# Patient Record
Sex: Male | Born: 2000
Health system: Southern US, Community
[De-identification: ages and names within clinical notes are randomized; demographics above are authoritative.]

---
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
## 2020-12-17 DIAGNOSIS — R635 Abnormal weight gain: Secondary | ICD-10-CM | POA: Diagnosis not present

## 2020-12-17 DIAGNOSIS — Z0001 Encounter for general adult medical examination with abnormal findings: Secondary | ICD-10-CM | POA: Diagnosis not present

## 2021-01-04 ENCOUNTER — Other Ambulatory Visit: Payer: Self-pay

## 2021-01-04 ENCOUNTER — Encounter: Payer: Self-pay | Admitting: Emergency Medicine

## 2021-01-04 ENCOUNTER — Ambulatory Visit
Admission: EM | Admit: 2021-01-04 | Discharge: 2021-01-04 | Disposition: A | Discharge status: Home / Self Care | Payer: Medicaid Other

## 2021-01-04 ENCOUNTER — Ambulatory Visit (INDEPENDENT_AMBULATORY_CARE_PROVIDER_SITE_OTHER): Payer: Medicaid Other

## 2021-01-04 ENCOUNTER — Ambulatory Visit
Admission: RE | Admit: 2021-01-04 | Discharge: 2021-01-04 | Discharge status: Absent without leave | Payer: Medicaid Other | Source: Ambulatory Visit

## 2021-01-04 DIAGNOSIS — R0781 Pleurodynia: Secondary | ICD-10-CM

## 2021-01-04 DIAGNOSIS — R0789 Other chest pain: Secondary | ICD-10-CM

## 2021-01-04 MED ORDER — IBUPROFEN 800 MG PO TABS: 800.0000 mg | ORAL_TABLET | Freq: Three times a day (TID) | ORAL | 0 refills | Status: AC | PRN

## 2021-01-04 NOTE — Discharge Instructions (Signed)
Your pain is more of the superior chest and not really consistent with shoulder pain.  Additionally, you have normal range of motion of your shoulder and no injury to the shoulder so I do not suspect the pain is due to a problem with your shoulder.  A chest x-ray was obtained today since the pain is associated with breathing.  A chest x-ray is completely within normal limits.  We also obtained an EKG to check your heart rhythm and regularity.  The EKG is completely normal.  I suspect a musculoskeletal cause for your chest pain.  I would advise increasing rest and fluids.  I have sent prescription strength ibuprofen to help with the pain.  You can also take Tylenol if you need it.  You can try using ice or warm compresses to the area to see if that helps.  Go to emergency department if you have any severe acute worsening of the pain or if it is associated with any shortness of breath, dizziness, palpitations or racing heart, weakness or feeling faint/passing out.  Follow-up with your PCP if this is not getting better over the next week.

## 2021-01-04 NOTE — ED Triage Notes (Signed)
Pt c/o left shoulder pain when he takes a deep breath. started about 3 days ago, denies injury.

## 2021-01-04 NOTE — ED Provider Notes (Addendum)
[facility]    CSN: [id number] Arrival date & time: 01/04/21  1848      History   Chief Complaint Chief Complaint  Patient presents with  . Shoulder Pain    left    HPI Adam Shields. is a 20 y.o. male presenting for left superior chest pain on breathing x 3 days. The area of concern is just distal to the left clavicle. Pain is only associated with breathing. Denies any other symptoms. Pain only improved when not breathing deeply.  Denies fever, fatigue, cough, congestion, back pain, palpitations, dizziness, weakness, shortness of breath, wheezing. No injury or trauma. No change in pain with movement of left extremity. No pain of the actual shoulder. No n/t/w. No history of similar problems. Hx is significant for tobacco use. No recent illness. No hx of asthma. Denies known COVID exposure. No other concerns.  HPI  History reviewed. No pertinent past medical history.  There are no problems to display for this patient.   History reviewed. No pertinent surgical history.     Home Medications    Prior to Admission medications   Medication Sig Start Date End Date Taking? Authorizing Provider  ibuprofen (ADVIL) 800 MG tablet Take 1 tablet (800 mg total) by mouth every 8 (eight) hours as needed for up to 7 days for moderate pain. 01/04/21 01/11/21 Yes Shirlee Latch, PA-C  famotidine (PEPCID) 20 MG tablet Take 1 tablet (20 mg total) by mouth 2 (two) times daily. 06/20/17   Emily Filbert, MD  polyethylene glycol (MIRALAX / Ethelene Hal) packet Take 17 g by mouth daily. 06/20/17   Emily Filbert, MD  polyethylene glycol [facility]) packet Take 17 g by mouth daily. 08/22/16   Jennye Moccasin, MD  VYVANSE 40 MG capsule Take 40 mg by mouth every morning. 12/02/20   [provider]    Family History No family history on file.  Social History Social History   Tobacco Use  . Smoking status: Current Every Day Smoker    Types: Cigarettes  . Smokeless  tobacco: Never Used  Vaping Use  . Vaping Use: Never used  Substance Use Topics  . Alcohol use: No  . Drug use: No     Allergies   Patient has no known allergies.   Review of Systems Review of Systems  Constitutional: Negative for diaphoresis, fatigue and fever.  Respiratory: Negative for cough, shortness of breath and wheezing.   Cardiovascular: Positive for chest pain. Negative for palpitations.  Musculoskeletal: Negative for arthralgias, back pain, joint swelling and myalgias.  Skin: Negative for color change, rash and wound.  Neurological: Negative for dizziness, weakness and numbness.     Physical Exam Triage Vital Signs ED Triage Vitals  Enc Vitals Group     BP 01/04/21 1901 (!) 137/91     Pulse Rate 01/04/21 1901 85     Resp 01/04/21 1901 18     Temp 01/04/21 1901 98.5 F (36.9 C)     Temp Source 01/04/21 1901 Oral     SpO2 01/04/21 1901 100 %     Weight 01/04/21 1858 155 lb (70.3 kg)     Height 01/04/21 1858 5\' 5"  (1.651 m)     Head Circumference --      Peak Flow --      Pain Score 01/04/21 1858 6     Pain Loc --      Pain Edu? --      Excl. in GC? --  No data found.  Updated Vital Signs BP (!) 137/91 (BP Location: Right Arm)   Pulse 85   Temp 98.5 F (36.9 C) (Oral)   Resp 18   Ht 5\' 5"  (1.651 m)   Wt 155 lb (70.3 kg)   SpO2 100%   BMI 25.79 kg/m       Physical Exam Vitals and nursing note reviewed.  Constitutional:      General: He is not in acute distress.    Appearance: Normal appearance. He is well-developed and well-nourished. He is not ill-appearing.  HENT:     Head: Normocephalic and atraumatic.  Eyes:     General: No scleral icterus.    Conjunctiva/sclera: Conjunctivae normal.  Cardiovascular:     Rate and Rhythm: Normal rate and regular rhythm.     Heart sounds: Normal heart sounds.  Pulmonary:     Effort: Pulmonary effort is normal. No respiratory distress.     Breath sounds: Normal breath sounds. No wheezing, rhonchi or  rales.  Chest:     Chest wall: No tenderness.  Musculoskeletal:        General: No swelling, tenderness (no L shoulder TTP or reduced ROM), deformity or edema. Normal range of motion.     Cervical back: Neck supple.  Skin:    General: Skin is warm and dry.     Findings: No bruising or lesion.  Neurological:     General: No focal deficit present.     Mental Status: He is alert. Mental status is at baseline.     Motor: No weakness.     Gait: Gait normal.  Psychiatric:        Mood and Affect: Mood and affect and mood normal.        Behavior: Behavior normal.        Thought Content: Thought content normal.      UC Treatments / Results  Labs (all labs ordered are listed, but only abnormal results are displayed) Labs Reviewed - No data to display  EKG   Radiology DG Chest 2 View  Result Date: 01/04/2021 CLINICAL DATA:  Initial evaluation for acute chest pain with inspiration. EXAM: CHEST - 2 VIEW COMPARISON:  None available. FINDINGS: The cardiac and mediastinal silhouettes are within normal limits. The lungs are mildly hypoinflated. No airspace consolidation, pleural effusion, or pulmonary edema. No pneumothorax. No acute osseous abnormality. IMPRESSION: No active cardiopulmonary disease. Electronically Signed   By: 01/06/2021 M.D.   On: 01/04/2021 19:27    Procedures ED EKG  Date/Time: 01/04/2021 7:39 PM Performed by: 01/06/2021, PA-C Authorized by: Shirlee Latch, PA-C   ECG reviewed by ED Physician in the absence of a cardiologist: yes   Previous ECG:    Previous ECG:  Unavailable Interpretation:    Interpretation: normal   Rate:    ECG rate:  69   ECG rate assessment: normal   Rhythm:    Rhythm: sinus rhythm   Ectopy:    Ectopy: none   QRS:    QRS axis:  Normal ST segments:    ST segments:  Normal T waves:    T waves: normal   Comments:     Normal sinus rhythm, regular rate   (including critical care time)  Medications Ordered in  UC Medications - No data to display  Initial Impression / Assessment and Plan / UC Course  I have reviewed the triage vital signs and the nursing notes.  Pertinent labs & imaging results that  were available during my care of the patient were reviewed by me and considered in my medical decision making (see chart for details).   20 year old healthy male presenting for left superior chest pain x3 days.  Patient states that the only exacerbating factor is breathing and the only relieving factor is shallow breathing or holding his breath.  He has not taken any medications to help with symptoms however.  All vital signs are stable.  Patient is in no acute distress.  Exam is completely benign.  Chest is clear to auscultation heart regular rate and rhythm.  Oxygen saturation is 100%.  Additionally, he has no tenderness of the chest or of the shoulder.  He has full range of motion of the left shoulder without pain.  Chest x-ray obtained today since his pain is pleuritic.  DDx does include pneumothorax, pneumonia, chest mass, pulmonary embolism, pleurisy, costochondritis, chest wall pain/muscles strain.  Chest x-ray independently reviewed by me.  Chest x-ray interpreted by radiologist as within normal limits.  Agree with findings.  No real suspicion for PE given normal vital signs and no history of blood clotting disorder or DVT/PE.  EKG obtained to assess for possible pericarditis/myocarditis or other findings. EKG is WNL. Normal sinus rhythm and regular rate at 69 bpm.  Discussed all results with patient.  Advised him that his pain is likely musculoskeletal in origin.  Encouraged RICE and I sent prescription strength Motrin to the pharmacy.  Advised him he can also use Tylenol if needed for pain relief but I expect this will get better over the next couple of days.  Red flags discussed with patient for any worsening symptoms.  Patient understanding and agreeable.   Final Clinical Impressions(s) / UC  Diagnoses   Final diagnoses:  Pleuritic pain  Chest wall pain     Discharge Instructions     Your pain is more of the superior chest and not really consistent with shoulder pain.  Additionally, you have normal range of motion of your shoulder and no injury to the shoulder so I do not suspect the pain is due to a problem with your shoulder.  A chest x-ray was obtained today since the pain is associated with breathing.  A chest x-ray is completely within normal limits.  We also obtained an EKG to check your heart rhythm and regularity.  The EKG is completely normal.  I suspect a musculoskeletal cause for your chest pain.  I would advise increasing rest and fluids.  I have sent prescription strength ibuprofen to help with the pain.  You can also take Tylenol if you need it.  You can try using ice or warm compresses to the area to see if that helps.  Go to emergency department if you have any severe acute worsening of the pain or if it is associated with any shortness of breath, dizziness, palpitations or racing heart, weakness or feeling faint/passing out.  Follow-up with your PCP if this is not getting better over the next week.    ED Prescriptions    Medication Sig Dispense Auth. Provider   ibuprofen (ADVIL) 800 MG tablet Take 1 tablet (800 mg total) by mouth every 8 (eight) hours as needed for up to 7 days for moderate pain. 21 tablet Shirlee Latch, PA-C     PDMP not reviewed this encounter.   Shirlee Latch, PA-C 01/04/21 1950    Shirlee Latch, PA-C 01/04/21 1950

## 2021-03-17 ENCOUNTER — Emergency Department
Admission: EM | Admit: 2021-03-17 | Discharge: 2021-03-17 | Disposition: A | Discharge status: Home / Self Care | Payer: Medicaid Other | Attending: Emergency Medicine | Admitting: Emergency Medicine

## 2021-03-17 ENCOUNTER — Other Ambulatory Visit: Payer: Self-pay

## 2021-03-17 DIAGNOSIS — K625 Hemorrhage of anus and rectum: Secondary | ICD-10-CM | POA: Diagnosis not present

## 2021-03-17 DIAGNOSIS — R1032 Left lower quadrant pain: Secondary | ICD-10-CM | POA: Insufficient documentation

## 2021-03-17 DIAGNOSIS — R197 Diarrhea, unspecified: Secondary | ICD-10-CM | POA: Diagnosis present

## 2021-03-17 DIAGNOSIS — F17210 Nicotine dependence, cigarettes, uncomplicated: Secondary | ICD-10-CM | POA: Insufficient documentation

## 2021-03-17 LAB — CBC
HCT: 40.8 % (ref 39.0–52.0)
Hemoglobin: 14.2 g/dL (ref 13.0–17.0)
MCH: 29.8 pg (ref 26.0–34.0)
MCHC: 34.8 g/dL (ref 30.0–36.0)
MCV: 85.7 fL (ref 80.0–100.0)
Platelets: 268 10*3/uL (ref 150–400)
RBC: 4.76 MIL/uL (ref 4.22–5.81)
RDW: 12.5 % (ref 11.5–15.5)
WBC: 6.6 10*3/uL (ref 4.0–10.5)
nRBC: 0 % (ref 0.0–0.2)

## 2021-03-17 LAB — URINALYSIS, COMPLETE (UACMP) WITH MICROSCOPIC
Bacteria, UA: NONE SEEN
Bilirubin Urine: NEGATIVE
Glucose, UA: NEGATIVE mg/dL
Hgb urine dipstick: NEGATIVE
Ketones, ur: NEGATIVE mg/dL
Leukocytes,Ua: NEGATIVE
Nitrite: NEGATIVE
Protein, ur: NEGATIVE mg/dL
Specific Gravity, Urine: 1.02 (ref 1.005–1.030)
Squamous Epithelial / LPF: NONE SEEN (ref 0–5)
pH: 7 (ref 5.0–8.0)

## 2021-03-17 LAB — COMPREHENSIVE METABOLIC PANEL
ALT: 30 U/L (ref 0–44)
AST: 23 U/L (ref 15–41)
Albumin: 4.1 g/dL (ref 3.5–5.0)
Alkaline Phosphatase: 96 U/L (ref 38–126)
Anion gap: 6 (ref 5–15)
BUN: 8 mg/dL (ref 6–20)
CO2: 25 mmol/L (ref 22–32)
Calcium: 9.2 mg/dL (ref 8.9–10.3)
Chloride: 107 mmol/L (ref 98–111)
Creatinine, Ser: 0.83 mg/dL (ref 0.61–1.24)
GFR, Estimated: >60 mL/min (ref >60)
Glucose, Bld: 95 mg/dL (ref 70–99)
Potassium: 3.5 mmol/L (ref 3.5–5.1)
Sodium: 138 mmol/L (ref 135–145)
Total Bilirubin: 0.4 mg/dL (ref 0.3–1.2)
Total Protein: 7.9 g/dL (ref 6.5–8.1)

## 2021-03-17 LAB — LIPASE, BLOOD: Lipase: 41 U/L (ref 11–51)

## 2021-03-17 LAB — SAMPLE TO BLOOD BANK

## 2021-03-17 MED ORDER — LOPERAMIDE HCL 2 MG PO CAPS: 2.0000 mg | ORAL_CAPSULE | ORAL | 0 refills | Status: DC | PRN

## 2021-03-17 NOTE — ED Triage Notes (Signed)
Pt states that he has been having diarrhea for the past 4-5 days states that he has occasionally seen small amounts of red in his stool, states today he has had 3 loose stools and the last one was all bright red, denies any history of the same, denies eating or drinking anything red

## 2021-03-17 NOTE — ED Notes (Signed)
Pt reports blood tinged stools x 2-3 days, noticed more bright red blood tonight so he came to ED. Endorses LLQ abd cramping as well. +diarrhea. AAOx4 NAD VSS

## 2021-03-17 NOTE — ED Provider Notes (Signed)
[facility] Emergency Department Provider Note   ____________________________________________   Event Date/Time   First MD Initiated Contact with Patient 03/17/21 1931     (approximate)  I have reviewed the triage vital signs and the nursing notes.   HISTORY  Chief Complaint Abdominal Pain and GI Bleeding    HPI Adam Shields. is a 20 y.o. male with no significant past medical history who presents to the ED complaining of abdominal pain and bloody stool.  Patient reports that he has had 2 to 3 days of diarrhea that started out thin and watery.  More recently has noticed small streaks of blood in his stool that seem to increase today.  This is associated with crampy pain in the left lower quadrant of his abdomen, he denies any fevers, nausea, or vomiting.  He has not had any dysuria or hematuria.  He denies any similar symptoms in the past, has never had blood in his stool before.  He has had some pain in his rectal area with the bowel movements.        No past medical history on file.  There are no problems to display for this patient.   No past surgical history on file.  Prior to Admission medications   Medication Sig Start Date End Date Taking? Authorizing Provider  loperamide (IMODIUM) 2 MG capsule Take 1 capsule (2 mg total) by mouth as needed for diarrhea or loose stools. 03/17/21  Yes Chesley Noon, MD  famotidine (PEPCID) 20 MG tablet Take 1 tablet (20 mg total) by mouth 2 (two) times daily. 06/20/17   Emily Filbert, MD  polyethylene glycol (MIRALAX / Ethelene Hal) packet Take 17 g by mouth daily. 06/20/17   Emily Filbert, MD  polyethylene glycol [facility]) packet Take 17 g by mouth daily. 08/22/16   Jennye Moccasin, MD  VYVANSE 40 MG capsule Take 40 mg by mouth every morning. 12/02/20   [provider]    Allergies Patient has no known allergies.  No family history on file.  Social History Social History   Tobacco  Use  . Smoking status: Current Every Day Smoker    Types: Cigarettes  . Smokeless tobacco: Never Used  Vaping Use  . Vaping Use: Never used  Substance Use Topics  . Alcohol use: No  . Drug use: No    Review of Systems  Constitutional: No fever/chills Eyes: No visual changes. ENT: No sore throat. Cardiovascular: Denies chest pain. Respiratory: Denies shortness of breath. Gastrointestinal: Positive for abdominal pain.  No nausea, no vomiting.  Positive for diarrhea.  No constipation.  Positive for bloody stools. Genitourinary: Negative for dysuria. Musculoskeletal: Negative for back pain. Skin: Negative for rash. Neurological: Negative for headaches, focal weakness or numbness.  ____________________________________________   PHYSICAL EXAM:  VITAL SIGNS: ED Triage Vitals [03/17/21 1743]  Enc Vitals Group     BP 124/82     Pulse Rate 73     Resp 16     Temp 98.3 F (36.8 C)     Temp Source Oral     SpO2 99 %     Weight 165 lb (74.8 kg)     Height 5\' 6"  (1.676 m)     Head Circumference      Peak Flow      Pain Score 3     Pain Loc      Pain Edu?      Excl. in GC?     Constitutional:  Alert and oriented. Eyes: Conjunctivae are normal. Head: Atraumatic. Nose: No congestion/rhinnorhea. Mouth/Throat: Mucous membranes are moist. Neck: Normal ROM Cardiovascular: Normal rate, regular rhythm. Grossly normal heart sounds. Respiratory: Normal respiratory effort.  No retractions. Lungs CTAB. Gastrointestinal: Soft and nontender. No distention.  Rectal exam with small anal fissures, no hemorrhoids noted.  Stool is guaiac negative. Genitourinary: deferred Musculoskeletal: No lower extremity tenderness nor edema. Neurologic:  Normal speech and language. No gross focal neurologic deficits are appreciated. Skin:  Skin is warm, dry and intact. No rash noted. Psychiatric: Mood and affect are normal. Speech and behavior are  normal.  ____________________________________________   LABS (all labs ordered are listed, but only abnormal results are displayed)  Labs Reviewed  URINALYSIS, COMPLETE (UACMP) WITH MICROSCOPIC - Abnormal; Notable for the following components:      Result Value   Color, Urine YELLOW (*)    APPearance CLEAR (*)    All other components within normal limits  LIPASE, BLOOD  COMPREHENSIVE METABOLIC PANEL  CBC  SAMPLE TO BLOOD BANK    PROCEDURES  Procedure(s) performed (including Critical Care):  Procedures   ____________________________________________   INITIAL IMPRESSION / ASSESSMENT AND PLAN / ED COURSE       20 year old male with no significant past medical history presents the ED complaining of 2 to 3 days of diarrhea associated with streaks of blood in his stool.  He complains of crampy left lower quadrant abdominal pain but has no tenderness on exam.  Labs are reassuring, H&H is stable and I doubt significant GI bleeding at this time.  He does appear to have some anal fissures that are likely due to diarrhea and contributing to light bleeding.  Patient is appropriate for discharge home with prescription for loperamide, was counseled to follow-up with his PCP and return to the ED for new or worsening symptoms.  Patient agrees with plan.      ____________________________________________   FINAL CLINICAL IMPRESSION(S) / ED DIAGNOSES  Final diagnoses:  Diarrhea, unspecified type  Rectal bleeding     ED Discharge Orders         Ordered    loperamide (IMODIUM) 2 MG capsule  As needed        03/17/21 2025           Note:  This document was prepared using Dragon voice recognition software and may include unintentional dictation errors.   Chesley Noon, MD 03/17/21 2112

## 2021-08-04 ENCOUNTER — Encounter: Payer: Self-pay | Admitting: Emergency Medicine

## 2021-08-04 ENCOUNTER — Ambulatory Visit: Payer: Self-pay

## 2021-08-04 ENCOUNTER — Ambulatory Visit
Admission: EM | Admit: 2021-08-04 | Discharge: 2021-08-04 | Disposition: A | Discharge status: Home / Self Care | Payer: Medicaid Other | Attending: Sports Medicine | Admitting: Sports Medicine

## 2021-08-04 ENCOUNTER — Other Ambulatory Visit: Payer: Self-pay

## 2021-08-04 DIAGNOSIS — R059 Cough, unspecified: Secondary | ICD-10-CM | POA: Diagnosis not present

## 2021-08-04 DIAGNOSIS — J069 Acute upper respiratory infection, unspecified: Secondary | ICD-10-CM

## 2021-08-04 DIAGNOSIS — J028 Acute pharyngitis due to other specified organisms: Secondary | ICD-10-CM

## 2021-08-04 DIAGNOSIS — U071 COVID-19: Secondary | ICD-10-CM

## 2021-08-04 DIAGNOSIS — B9789 Other viral agents as the cause of diseases classified elsewhere: Secondary | ICD-10-CM

## 2021-08-04 DIAGNOSIS — B349 Viral infection, unspecified: Secondary | ICD-10-CM | POA: Diagnosis not present

## 2021-08-04 DIAGNOSIS — R0981 Nasal congestion: Secondary | ICD-10-CM | POA: Diagnosis not present

## 2021-08-04 DIAGNOSIS — R509 Fever, unspecified: Secondary | ICD-10-CM | POA: Diagnosis not present

## 2021-08-04 MED ORDER — FLUTICASONE PROPIONATE 50 MCG/ACT NA SUSP: 2.0000 | Freq: Every day | NASAL | 0 refills | Status: AC

## 2021-08-04 MED ORDER — MOLNUPIRAVIR EUA 200MG CAPSULE: 4.0000 | ORAL_CAPSULE | Freq: Two times a day (BID) | ORAL | 0 refills | Status: AC

## 2021-08-04 MED ORDER — PROMETHAZINE-DM 6.25-15 MG/5ML PO SYRP: 5.0000 mL | ORAL_SOLUTION | Freq: Four times a day (QID) | ORAL | 0 refills | Status: DC | PRN

## 2021-08-04 NOTE — ED Triage Notes (Signed)
Pt c/o sore throat, fatigue, fever (105 per pt), weakness. Started yesterday. He states he did a home test and was positive. Does not want to be retested. Pt is requesting antivirals.

## 2021-08-04 NOTE — ED Provider Notes (Signed)
[facility]    CSN: [id number] Arrival date & time: 08/04/21  1454      History   Chief Complaint Chief Complaint  Patient presents with   Covid Positive   Fever    HPI Adam Shields. is a 20 y.o. male.   20 year old male presents for evaluation of URI symptoms.  He reports his symptoms began yesterday.  He presents today with a sore throat, headache, fever, fatigue, cough, nasal congestion, and generalized malaise.  He denies any nausea vomiting or diarrhea.  He believes that he did have COVID exposure from a friend.  He has not been vaccinated against COVID.  He has not received the flu shot.  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  He does not have a primary care provider.  He denies chest pain or shortness of breath.  Overall he is generally healthy, does not take any medicines on a regular basis, and he denies any major medical issues.  No red flag signs or symptoms were elicited on history.   History reviewed. No pertinent past medical history.  There are no problems to display for this patient.   History reviewed. No pertinent surgical history.     Home Medications    Prior to Admission medications   Medication Sig Start Date End Date Taking? Authorizing Provider  fluticasone (FLONASE) 50 MCG/ACT nasal spray Place 2 sprays into both nostrils daily. 08/04/21  Yes Delton See, MD  molnupiravir EUA 200 mg CAPS Take 4 capsules (800 mg total) by mouth 2 (two) times daily for 5 days. 08/04/21 08/09/21 Yes Delton See, MD  promethazine-dextromethorphan (PROMETHAZINE-DM) 6.25-15 MG/5ML syrup Take 5 mLs by mouth 4 (four) times daily as needed for cough. 08/04/21  Yes Delton See, MD  famotidine (PEPCID) 20 MG tablet Take 1 tablet (20 mg total) by mouth 2 (two) times daily. 06/20/17   Emily Filbert, MD  loperamide (IMODIUM) 2 MG capsule Take 1 capsule (2 mg total) by mouth as needed for diarrhea or loose stools. 03/17/21   Chesley Noon, MD  polyethylene glycol [facility] / Ethelene Hal) packet Take 17 g by mouth daily. 06/20/17   Emily Filbert, MD  polyethylene glycol [facility]) packet Take 17 g by mouth daily. 08/22/16   Jennye Moccasin, MD  VYVANSE 40 MG capsule Take 40 mg by mouth every morning. 12/02/20   [provider]    Family History History reviewed. No pertinent family history.  Social History Social History   Tobacco Use   Smoking status: Former    Types: Cigarettes    Quit date: 06/04/2021    Years since quitting: 0.1   Smokeless tobacco: Never  Vaping Use   Vaping Use: Never used  Substance Use Topics   Alcohol use: No   Drug use: No     Allergies   Patient has no known allergies.   Review of Systems Review of Systems  Constitutional:  Positive for activity change, chills, fatigue and fever. Negative for appetite change and diaphoresis.  HENT:  Positive for congestion and sore throat. Negative for ear pain, postnasal drip, rhinorrhea, sinus pressure, sinus pain and sneezing.   Eyes:  Negative for pain.  Respiratory:  Positive for cough. Negative for chest tightness, shortness of breath and wheezing.   Cardiovascular:  Negative for chest pain and palpitations.  Gastrointestinal:  Negative for abdominal pain, diarrhea, nausea and vomiting.  Genitourinary:  Negative for dysuria.  Musculoskeletal:  Positive for myalgias.  Negative for back pain and neck pain.  Skin:  Negative for color change, pallor, rash and wound.  Neurological:  Positive for headaches. Negative for dizziness, syncope and light-headedness.  All other systems reviewed and are negative.   Physical Exam Triage Vital Signs ED Triage Vitals  Enc Vitals Group     BP 08/04/21 1518 114/66     Pulse Rate 08/04/21 1518 (!) 117     Resp 08/04/21 1518 20     Temp 08/04/21 1518 (!) 102.4 F (39.1 C)     Temp Source 08/04/21 1518 Oral     SpO2 08/04/21 1518 99 %     Weight 08/04/21 1514 164 lb 14.5 oz (74.8 kg)      Height 08/04/21 1514 5\' 6"  (1.676 m)     Head Circumference --      Peak Flow --      Pain Score 08/04/21 1513 7     Pain Loc --      Pain Edu? --      Excl. in GC? --    No data found.  Updated Vital Signs BP 114/66 (BP Location: Left Arm)   Pulse (!) 117   Temp (!) 102.4 F (39.1 C) (Oral) Comment: unsure the last time he had medication  Resp 20   Ht 5\' 6"  (1.676 m)   Wt 74.8 kg   SpO2 99%   BMI 26.62 kg/m   Visual Acuity Right Eye Distance:   Left Eye Distance:   Bilateral Distance:    Right Eye Near:   Left Eye Near:    Bilateral Near:     Physical Exam Vitals and nursing note reviewed.  Constitutional:      General: He is not in acute distress.    Appearance: Normal appearance. He is not ill-appearing, toxic-appearing or diaphoretic.  HENT:     Head: Normocephalic and atraumatic.     Right Ear: Tympanic membrane normal.     Left Ear: Tympanic membrane normal.     Nose: Congestion and rhinorrhea present.     Mouth/Throat:     Mouth: Mucous membranes are moist.     Pharynx: Posterior oropharyngeal erythema present. No oropharyngeal exudate.  Eyes:     General: No scleral icterus.       Right eye: No discharge.        Left eye: No discharge.     Extraocular Movements: Extraocular movements intact.     Conjunctiva/sclera: Conjunctivae normal.     Pupils: Pupils are equal, round, and reactive to light.  Cardiovascular:     Rate and Rhythm: Normal rate and regular rhythm.     Pulses: Normal pulses.     Heart sounds: Normal heart sounds. No murmur heard.   No friction rub. No gallop.  Pulmonary:     Effort: Pulmonary effort is normal.     Breath sounds: Normal breath sounds. No stridor. No wheezing, rhonchi or rales.  Musculoskeletal:     Cervical back: Normal range of motion and neck supple. No rigidity or tenderness.  Lymphadenopathy:     Cervical: No cervical adenopathy.  Skin:    General: Skin is warm and dry.     Capillary Refill: Capillary refill  takes less than 2 seconds.     Coloration: Skin is not jaundiced.     Findings: No rash.  Neurological:     General: No focal deficit present.     Mental Status: He is alert and oriented to person, place, and  time.     GCS: GCS eye subscore is 4. GCS verbal subscore is 5. GCS motor subscore is 6.     Cranial Nerves: Cranial nerves are intact.     Sensory: Sensation is intact.     Motor: Motor function is intact.     Coordination: Coordination is intact.     UC Treatments / Results  Labs (all labs ordered are listed, but only abnormal results are displayed) Labs Reviewed - No data to display  EKG   Radiology No results found.  Procedures Procedures (including critical care time)  Medications Ordered in UC Medications - No data to display  Initial Impression / Assessment and Plan / UC Course  I have reviewed the triage vital signs and the nursing notes.  Pertinent labs & imaging results that were available during my care of the patient were reviewed by me and considered in my medical decision making (see chart for details).  Clinical impression: 1.  Positive COVID-19 home test 2.  Febrile illness 3.  Upper respiratory infection 4.  Cough 5.  Nasal congestion 6.  Sore throat viral in nature 7.  Viral illness  Treatment plan: 1.  The findings and treatment plan were discussed in detail with the patient.  Patient was in agreement. 2.  We discussed repeating his home COVID test but he declined.  Given his symptoms and his vital signs as well as his positive home COVID test I will treat him accordingly.  He was asking for oral antivirals for COVID.  I went ahead and prescribed.  I also prescribed something for his cough and his nasal congestion.  I did warn him of the side effects and the fact that he does not have any major medical issues and that there are side effects with this medicine.  He voiced verbal understanding but wanted the medicine anyway. 3.  Educational  handouts provided. 4.  Indicated to him that the mainstay of treatment is going to be round-the-clock Tylenol and ibuprofen to keep his fever down and he will feel much better. 5.  Supportive care, over-the-counter meds as needed, salt water gargles, throat lozenges, and supportive care. 6.  If symptoms worsen I advised him to go to the ER. 7.  Give him a work note keep him out of work for at least 5 days. 8.  I advised him that he needed to quarantine for least 5 days per the current CDC guidelines. 9.  He was stable upon discharge and will follow-up here as needed.    Final Clinical Impressions(s) / UC Diagnoses   Final diagnoses:  COVID-19  Febrile illness  Upper respiratory tract infection, unspecified type  Viral illness  Cough  Nasal congestion  Sore throat (viral)     Discharge Instructions      As we discussed, with your COVID exposure, you are positive home test, and your vital signs I am going to go ahead and treat you with one of the new antiviral COVID medication.  This is per your request.  I did explain that there are side effects to this medication and you do not have any significant medical conditions.  I am also prescribing something for cough and nasal congestion.  The mainstay of treatment is going to be keeping her fever down with round-the-clock Tylenol and ibuprofen or Motrin. Please see educational handouts. If your symptoms were to worsen in any way then please go to the emergency room or call 911. I did give you a  work note keep you out of work for at least 5 days per the current CDC guidelines.  If you are still symptomatic after 5 days then you need to return here for reevaluation or go to the ER or your primary care provider.     ED Prescriptions     Medication Sig Dispense Auth. Provider   molnupiravir EUA 200 mg CAPS Take 4 capsules (800 mg total) by mouth 2 (two) times daily for 5 days. 40 capsule Delton SeeBarnes, Rachyl Wuebker, MD   promethazine-dextromethorphan  (PROMETHAZINE-DM) 6.25-15 MG/5ML syrup Take 5 mLs by mouth 4 (four) times daily as needed for cough. 180 mL Delton SeeBarnes, Ardena Gangl, MD   fluticasone [facility](FLONASE) 50 MCG/ACT nasal spray Place 2 sprays into both nostrils daily. 15.8 mL Delton SeeBarnes, Alizabeth Antonio, MD      PDMP not reviewed this encounter.   Delton SeeBarnes, James Senn, MD 08/04/21 1810

## 2021-08-04 NOTE — Discharge Instructions (Addendum)
As we discussed, with your COVID exposure, you are positive home test, and your vital signs I am going to go ahead and treat you with one of the new antiviral COVID medication.  This is per your request.  I did explain that there are side effects to this medication and you do not have any significant medical conditions.  I am also prescribing something for cough and nasal congestion.  The mainstay of treatment is going to be keeping her fever down with round-the-clock Tylenol and ibuprofen or Motrin. Please see educational handouts. If your symptoms were to worsen in any way then please go to the emergency room or call 911. I did give you a work note keep you out of work for at least 5 days per the current CDC guidelines.  If you are still symptomatic after 5 days then you need to return here for reevaluation or go to the ER or your primary care provider.

## 2021-08-31 ENCOUNTER — Other Ambulatory Visit: Payer: Self-pay

## 2021-08-31 ENCOUNTER — Emergency Department: Payer: Medicaid Other

## 2021-08-31 ENCOUNTER — Emergency Department
Admission: EM | Admit: 2021-08-31 | Discharge: 2021-08-31 | Disposition: A | Discharge status: Home / Self Care | Payer: Medicaid Other | Attending: Emergency Medicine | Admitting: Emergency Medicine

## 2021-08-31 DIAGNOSIS — S91331A Puncture wound without foreign body, right foot, initial encounter: Secondary | ICD-10-CM | POA: Diagnosis not present

## 2021-08-31 DIAGNOSIS — W450XXA Nail entering through skin, initial encounter: Secondary | ICD-10-CM | POA: Insufficient documentation

## 2021-08-31 DIAGNOSIS — Y990 Civilian activity done for income or pay: Secondary | ICD-10-CM | POA: Diagnosis not present

## 2021-08-31 DIAGNOSIS — S91301A Unspecified open wound, right foot, initial encounter: Secondary | ICD-10-CM | POA: Insufficient documentation

## 2021-08-31 DIAGNOSIS — Z87891 Personal history of nicotine dependence: Secondary | ICD-10-CM | POA: Diagnosis not present

## 2021-08-31 DIAGNOSIS — S99921A Unspecified injury of right foot, initial encounter: Secondary | ICD-10-CM

## 2021-08-31 MED ORDER — CIPROFLOXACIN HCL 500 MG PO TABS: 500.0000 mg | ORAL_TABLET | Freq: Two times a day (BID) | ORAL | 0 refills | Status: AC

## 2021-08-31 NOTE — ED Provider Notes (Signed)
[facility] Emergency Department Provider Note ____________________________________________   Event Date/Time   First MD Initiated Contact with Patient 08/31/21 1152     (approximate)  I have reviewed the triage vital signs and the nursing notes.  HISTORY  Chief Complaint Foot Injury (PT STEPPED ON A NAIL RIGHT FOOT )   HPI Adam Shields. is a 20 y.o. malewho presents to the ED for evaluation of foot puncture wound  Chart review indicates no relevant history.  Patient reports accidentally stepping on a rusty nail while at work today.  He reports a puncture wound to the plantar aspect of his right foot, going through his work boots and socks.  He reports this injury happened just prior to arrival.  Reports mild nonradiating sharp pains.  He reports that he finished high school and had all the shots up-to-date, and thinks he had a Tdap in the past few years for school.  History reviewed. No pertinent past medical history.  There are no problems to display for this patient.   History reviewed. No pertinent surgical history.  Prior to Admission medications   Medication Sig Start Date End Date Taking? Authorizing Provider  ciprofloxacin (CIPRO) 500 MG tablet Take 1 tablet (500 mg total) by mouth 2 (two) times daily for 7 days. 08/31/21 09/07/21 Yes Delton Prairie, MD  famotidine (PEPCID) 20 MG tablet Take 1 tablet (20 mg total) by mouth 2 (two) times daily. 06/20/17   Emily Filbert, MD  fluticasone (FLONASE) 50 MCG/ACT nasal spray Place 2 sprays into both nostrils daily. 08/04/21   Delton See, MD  loperamide (IMODIUM) 2 MG capsule Take 1 capsule (2 mg total) by mouth as needed for diarrhea or loose stools. 03/17/21   Chesley Noon, MD  polyethylene glycol [facility] / Ethelene Hal) packet Take 17 g by mouth daily. 06/20/17   Emily Filbert, MD  polyethylene glycol [facility]) packet Take 17 g by mouth daily. 08/22/16   Jennye Moccasin, MD   promethazine-dextromethorphan (PROMETHAZINE-DM) 6.25-15 MG/5ML syrup Take 5 mLs by mouth 4 (four) times daily as needed for cough. 08/04/21   Delton See, MD  VYVANSE 40 MG capsule Take 40 mg by mouth every morning. 12/02/20   [provider]    Allergies Patient has no known allergies.  No family history on file.  Social History Social History   Tobacco Use   Smoking status: Former    Types: Cigarettes    Quit date: 06/04/2021    Years since quitting: 0.2   Smokeless tobacco: Never  Vaping Use   Vaping Use: Never used  Substance Use Topics   Alcohol use: No   Drug use: No    Review of Systems  Constitutional: No fever/chills Eyes: No visual changes. ENT: No sore throat. Cardiovascular: Denies chest pain. Respiratory: Denies shortness of breath. Gastrointestinal: No abdominal pain.  No nausea, no vomiting.  No diarrhea.  No constipation. Genitourinary: Negative for dysuria. Musculoskeletal: Negative for back pain. Positive for right foot pain and puncture wound Skin: Negative for rash. Neurological: Negative for headaches, focal weakness or numbness.  ____________________________________________   PHYSICAL EXAM:  VITAL SIGNS: Vitals:   08/31/21 1127  BP: 126/85  Pulse: 69  Resp: 17  Temp: 98.8 F (37.1 C)  SpO2: 98%    Constitutional: Alert and oriented. Well appearing and in no acute distress. Eyes: Conjunctivae are normal. PERRL. EOMI. Head: Atraumatic. Nose: No congestion/rhinnorhea. Mouth/Throat: Mucous membranes are moist.  Oropharynx non-erythematous. Neck: No stridor. No  cervical spine tenderness to palpation. Cardiovascular: Normal rate, regular rhythm. Grossly normal heart sounds.  Good peripheral circulation. Respiratory: Normal respiratory effort.  No retractions. Lungs CTAB. Gastrointestinal: Soft , nondistended, nontender to palpation. No CVA tenderness. Musculoskeletal: No lower extremity tenderness nor edema.  No joint  effusions.  Puncture wound to the distal aspect of the plantar right foot.  Seems superficial, into the dermis only.  No laceration or bleeding pathology.  No bony tenderness throughout the foot and ankle.  No erythema, induration or purulence. Neurologic:  Normal speech and language. No gross focal neurologic deficits are appreciated. No gait instability noted. Skin:  Skin is warm, dry and intact. No rash noted. Psychiatric: Mood and affect are normal. Speech and behavior are normal.  ____________________________________________   LABS (all labs ordered are listed, but only abnormal results are displayed)  Labs Reviewed - No data to display ____________________________________________  12 Lead EKG   ____________________________________________  RADIOLOGY  ED MD interpretation:    Official radiology report(s): No results found.  ____________________________________________   PROCEDURES and INTERVENTIONS  Procedure(s) performed (including Critical Care):  Procedures  Medications - No data to display  ____________________________________________   MDM / ED COURSE   Otherwise healthy 20 year old male present to the ED with puncture wound to his right foot, amenable to outpatient management with prophylactic antibiotics.  Normal vitals and reassuring clinical exam.  He seems to have a very superficial puncture wound to his right foot, but did break into the dermis after going through his socks and shoes.  X-ray without evidence of foreign body or bony injury, no evidence of osteo or bony erosion.  He reports that he is up-to-date on his Tdap from his grade school vaccinations.  Considering the mechanism of injury, we will provide a course of ciprofloxacin for Pseudomonas prophylaxis.  Return precautions were discussed  Clinical Course as of 08/31/21 1607  Tue Aug 31, 2021  1223 We discussed need for imaging to assess for foreign body, and likely antibiotics prophylactically  considering the mechanism.  He reports that he is quite confident that his shots are up-to-date and does not need tetanus.  He is okay with not getting Tdap today because of this. [DS]    Clinical Course User Index [DS] Delton Prairie, MD    ____________________________________________   FINAL CLINICAL IMPRESSION(S) / ED DIAGNOSES  Final diagnoses:  Injury of right foot, initial encounter  Puncture wound of right foot, initial encounter     ED Discharge Orders          Ordered    ciprofloxacin (CIPRO) 500 MG tablet  2 times daily        08/31/21 1206             Melyna Huron   Note:  This document was prepared using Dragon voice recognition software and may include unintentional dictation errors.    Delton Prairie, MD 08/31/21 [phone]

## 2021-08-31 NOTE — ED Triage Notes (Signed)
First Nurse Note:  Stepped on nail with left foot.  Needs tetanus shot.

## 2021-08-31 NOTE — ED Notes (Signed)
See triage note. Pt stepped on a a nail with his right foot and is c/o pain.

## 2021-08-31 NOTE — ED Triage Notes (Signed)
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

## 2021-08-31 NOTE — Discharge Instructions (Signed)
Use the ciprofloxacin antibiotics twice daily for the next 7 days to prevent infection from your puncture wound.  I would urge you to use probiotics while taking this medication, this could be any over-the-counter probiotic medication, or just eating yogurt every day.  If develop any spreading red rash, fevers or worsening pain, return to the ED.  Please take Tylenol and ibuprofen/Advil for your pain.  It is safe to take them together, or to alternate them every few hours.  Take up to 1000mg  of Tylenol at a time, up to 4 times per day.  Do not take more than 4000 mg of Tylenol in 24 hours.  For ibuprofen, take 400-600 mg, 4-5 times per day.

## 2021-09-24 DIAGNOSIS — Z79899 Other long term (current) drug therapy: Secondary | ICD-10-CM | POA: Diagnosis not present

## 2021-09-24 DIAGNOSIS — F329 Major depressive disorder, single episode, unspecified: Secondary | ICD-10-CM | POA: Diagnosis not present

## 2021-09-24 DIAGNOSIS — F902 Attention-deficit hyperactivity disorder, combined type: Secondary | ICD-10-CM | POA: Diagnosis not present

## 2021-09-24 DIAGNOSIS — Z1389 Encounter for screening for other disorder: Secondary | ICD-10-CM | POA: Diagnosis not present

## 2021-12-03 DIAGNOSIS — Z0000 Encounter for general adult medical examination without abnormal findings: Secondary | ICD-10-CM | POA: Diagnosis not present

## 2021-12-24 DIAGNOSIS — F902 Attention-deficit hyperactivity disorder, combined type: Secondary | ICD-10-CM | POA: Diagnosis not present

## 2022-02-06 ENCOUNTER — Encounter: Payer: Self-pay | Admitting: Emergency Medicine

## 2022-02-06 ENCOUNTER — Other Ambulatory Visit: Payer: Self-pay

## 2022-02-06 ENCOUNTER — Ambulatory Visit
Admission: EM | Admit: 2022-02-06 | Discharge: 2022-02-06 | Disposition: A | Discharge status: Home / Self Care | Payer: Medicaid Other | Attending: Family Medicine | Admitting: Family Medicine

## 2022-02-06 DIAGNOSIS — Z711 Person with feared health complaint in whom no diagnosis is made: Secondary | ICD-10-CM | POA: Insufficient documentation

## 2022-02-06 DIAGNOSIS — Z0289 Encounter for other administrative examinations: Secondary | ICD-10-CM | POA: Diagnosis not present

## 2022-02-06 NOTE — ED Provider Notes (Signed)
Adam Shields    CSN: [id number] Arrival date & time: 02/06/22  1309      History   Chief Complaint Chief Complaint  Patient presents with   Abrasion    HPI Adam Shields. is a 21 y.o. male.   HPI Patient initially presented today following a abrasion on his penis.  After entering the room he refused to allow the examination however wanted STD testing and requested a work note.  He denies any known exposure to STDs   History reviewed. No pertinent past medical history.  There are no problems to display for this patient.   History reviewed. No pertinent surgical history.     Home Medications    Prior to Admission medications   Medication Sig Start Date End Date Taking? Authorizing Provider  VYVANSE 40 MG capsule Take 40 mg by mouth every morning. 12/02/20  Yes [provider]  famotidine (PEPCID) 20 MG tablet Take 1 tablet (20 mg total) by mouth 2 (two) times daily. 06/20/17   Earleen Newport, MD  fluticasone (FLONASE) 50 MCG/ACT nasal spray Place 2 sprays into both nostrils daily. 08/04/21   Verda Cumins, MD  loperamide (IMODIUM) 2 MG capsule Take 1 capsule (2 mg total) by mouth as needed for diarrhea or loose stools. 03/17/21   Blake Divine, MD  polyethylene glycol [facility] Floria Raveling) packet Take 17 g by mouth daily. 06/20/17   Earleen Newport, MD  polyethylene glycol [facility]) packet Take 17 g by mouth daily. 08/22/16   Daymon Larsen, MD  promethazine-dextromethorphan (PROMETHAZINE-DM) 6.25-15 MG/5ML syrup Take 5 mLs by mouth 4 (four) times daily as needed for cough. 08/04/21   Verda Cumins, MD    Family History History reviewed. No pertinent family history.  Social History Social History   Tobacco Use   Smoking status: Every Day    Types: Cigarettes    Last attempt to quit: 06/04/2021    Years since quitting: 0.6   Smokeless tobacco: Never  Vaping Use   Vaping Use: Never used  Substance Use Topics   Alcohol use: No    Drug use: No     Allergies   Patient has no known allergies.   Review of Systems Review of Systems Pertinent negatives listed in HPI   Physical Exam Triage Vital Signs ED Triage Vitals  Enc Vitals Group     BP 02/06/22 1505 131/83     Pulse Rate 02/06/22 1505 91     Resp 02/06/22 1505 16     Temp 02/06/22 1505 99.3 F (37.4 C)     Temp Source 02/06/22 1505 Oral     SpO2 02/06/22 1505 97 %     Weight --      Height --      Head Circumference --      Peak Flow --      Pain Score 02/06/22 1503 0     Pain Loc --      Pain Edu? --      Excl. in [place]? --    No data found.  Updated Vital Signs BP 131/83 (BP Location: Left Arm)    Pulse 91    Temp 99.3 F (37.4 C) (Oral)    Resp 16    SpO2 97%   Visual Acuity Right Eye Distance:   Left Eye Distance:   Bilateral Distance:    Right Eye Near:   Left Eye Near:    Bilateral Near:     Physical  Exam HENT:     Head: Normocephalic.  Eyes:     Extraocular Movements: Extraocular movements intact.     Pupils: Pupils are equal, round, and reactive to light.  Cardiovascular:     Rate and Rhythm: Normal rate.  Pulmonary:     Effort: Pulmonary effort is normal.  Neurological:     Mental Status: He is alert.  Psychiatric:        Mood and Affect: Mood normal.        Behavior: Behavior normal.        Thought Content: Thought content normal.        Judgment: Judgment normal.    UC Treatments / Results  Labs (all labs ordered are listed, but only abnormal results are displayed) Labs Reviewed  CYTOLOGY, (ORAL, ANAL, URETHRAL) ANCILLARY ONLY    EKG   Radiology No results found.  Procedures Procedures (including critical care time)  Medications Ordered in UC Medications - No data to display  Initial Impression / Assessment and Plan / UC Course  I have reviewed the triage vital signs and the nursing notes.  Pertinent labs & imaging results that were available during my care of the patient were reviewed by me and  considered in my medical decision making (see chart for details).    Urethral cytology pending. Work note provided. RTC PRN  Final Clinical Impressions(s) / UC Diagnoses   Final diagnoses:  Encounter to obtain excuse from work  Concern about STD in male without diagnosis   Discharge Instructions   None    ED Prescriptions   None    PDMP not reviewed this encounter.   Scot Jun, FNP 02/06/22 [phone]

## 2022-02-06 NOTE — ED Triage Notes (Signed)
Pt presents with an abrasion on the head of his penis. Pt does not want to disclose how he got the abrasion.

## 2022-02-07 LAB — CYTOLOGY, (ORAL, ANAL, URETHRAL) ANCILLARY ONLY
Chlamydia: NEGATIVE
Comment: NEGATIVE
Comment: NEGATIVE
Comment: NORMAL
Neisseria Gonorrhea: NEGATIVE
Trichomonas: NEGATIVE

## 2022-02-24 DIAGNOSIS — H5213 Myopia, bilateral: Secondary | ICD-10-CM | POA: Diagnosis not present

## 2022-03-18 DIAGNOSIS — F329 Major depressive disorder, single episode, unspecified: Secondary | ICD-10-CM | POA: Diagnosis not present

## 2022-03-18 DIAGNOSIS — F902 Attention-deficit hyperactivity disorder, combined type: Secondary | ICD-10-CM | POA: Diagnosis not present

## 2022-06-16 DIAGNOSIS — F902 Attention-deficit hyperactivity disorder, combined type: Secondary | ICD-10-CM | POA: Diagnosis not present

## 2022-06-16 DIAGNOSIS — F329 Major depressive disorder, single episode, unspecified: Secondary | ICD-10-CM | POA: Diagnosis not present

## 2022-09-09 DIAGNOSIS — F902 Attention-deficit hyperactivity disorder, combined type: Secondary | ICD-10-CM | POA: Diagnosis not present

## 2022-09-09 DIAGNOSIS — F329 Major depressive disorder, single episode, unspecified: Secondary | ICD-10-CM | POA: Diagnosis not present

## 2022-09-13 DIAGNOSIS — R635 Abnormal weight gain: Secondary | ICD-10-CM | POA: Diagnosis not present

## 2022-09-13 DIAGNOSIS — Z0001 Encounter for general adult medical examination with abnormal findings: Secondary | ICD-10-CM | POA: Diagnosis not present

## 2022-09-13 DIAGNOSIS — Z7251 High risk heterosexual behavior: Secondary | ICD-10-CM | POA: Diagnosis not present

## 2022-12-05 DIAGNOSIS — L2089 Other atopic dermatitis: Secondary | ICD-10-CM | POA: Diagnosis not present

## 2022-12-05 DIAGNOSIS — R635 Abnormal weight gain: Secondary | ICD-10-CM | POA: Diagnosis not present

## 2022-12-05 DIAGNOSIS — B349 Viral infection, unspecified: Secondary | ICD-10-CM | POA: Diagnosis not present

## 2022-12-05 DIAGNOSIS — J029 Acute pharyngitis, unspecified: Secondary | ICD-10-CM | POA: Diagnosis not present

## 2022-12-07 DIAGNOSIS — Z79899 Other long term (current) drug therapy: Secondary | ICD-10-CM | POA: Diagnosis not present

## 2022-12-07 DIAGNOSIS — Z1389 Encounter for screening for other disorder: Secondary | ICD-10-CM | POA: Diagnosis not present

## 2022-12-07 DIAGNOSIS — F902 Attention-deficit hyperactivity disorder, combined type: Secondary | ICD-10-CM | POA: Diagnosis not present

## 2022-12-07 DIAGNOSIS — F329 Major depressive disorder, single episode, unspecified: Secondary | ICD-10-CM | POA: Diagnosis not present

## 2022-12-15 IMAGING — DX DG FOOT COMPLETE 3+V*R*
3 series · 3 of 3 positions shown · non-contrast
Comparison: None.

CLINICAL DATA: Stepped on a nail

EXAM:
RIGHT FOOT COMPLETE - 3+ VIEW

[foot ap]
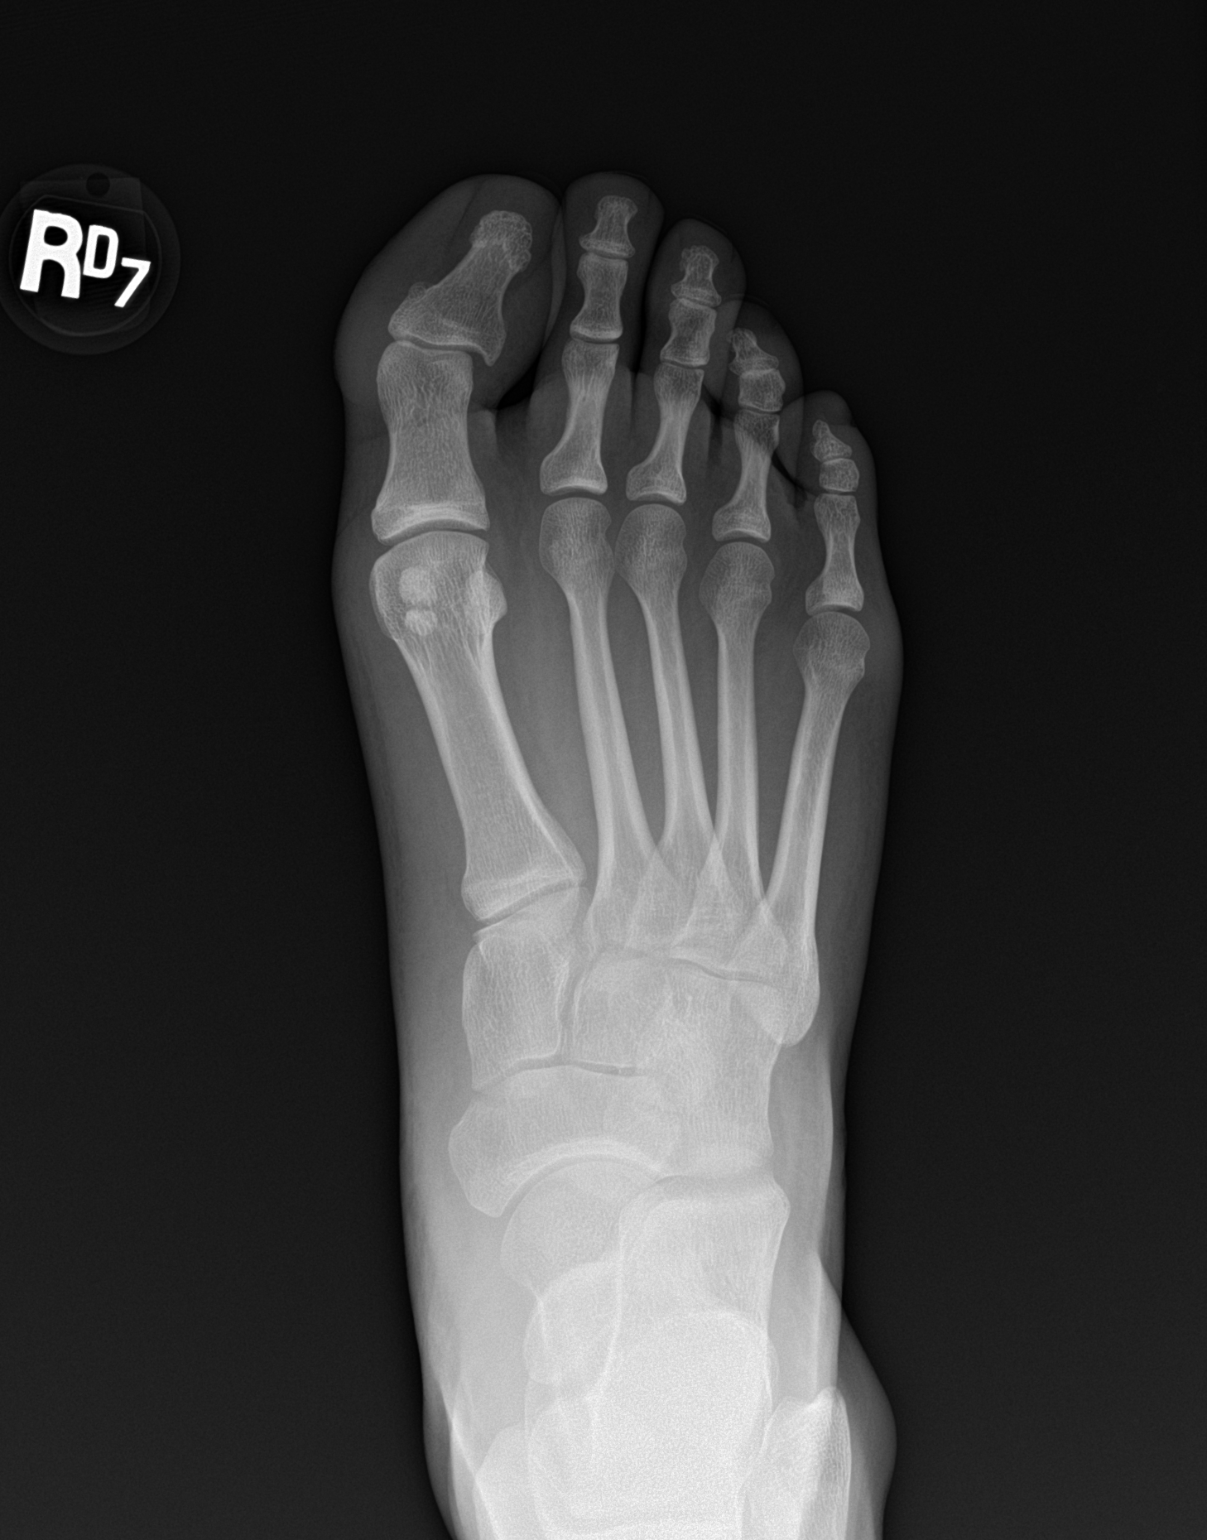

[foot obl]
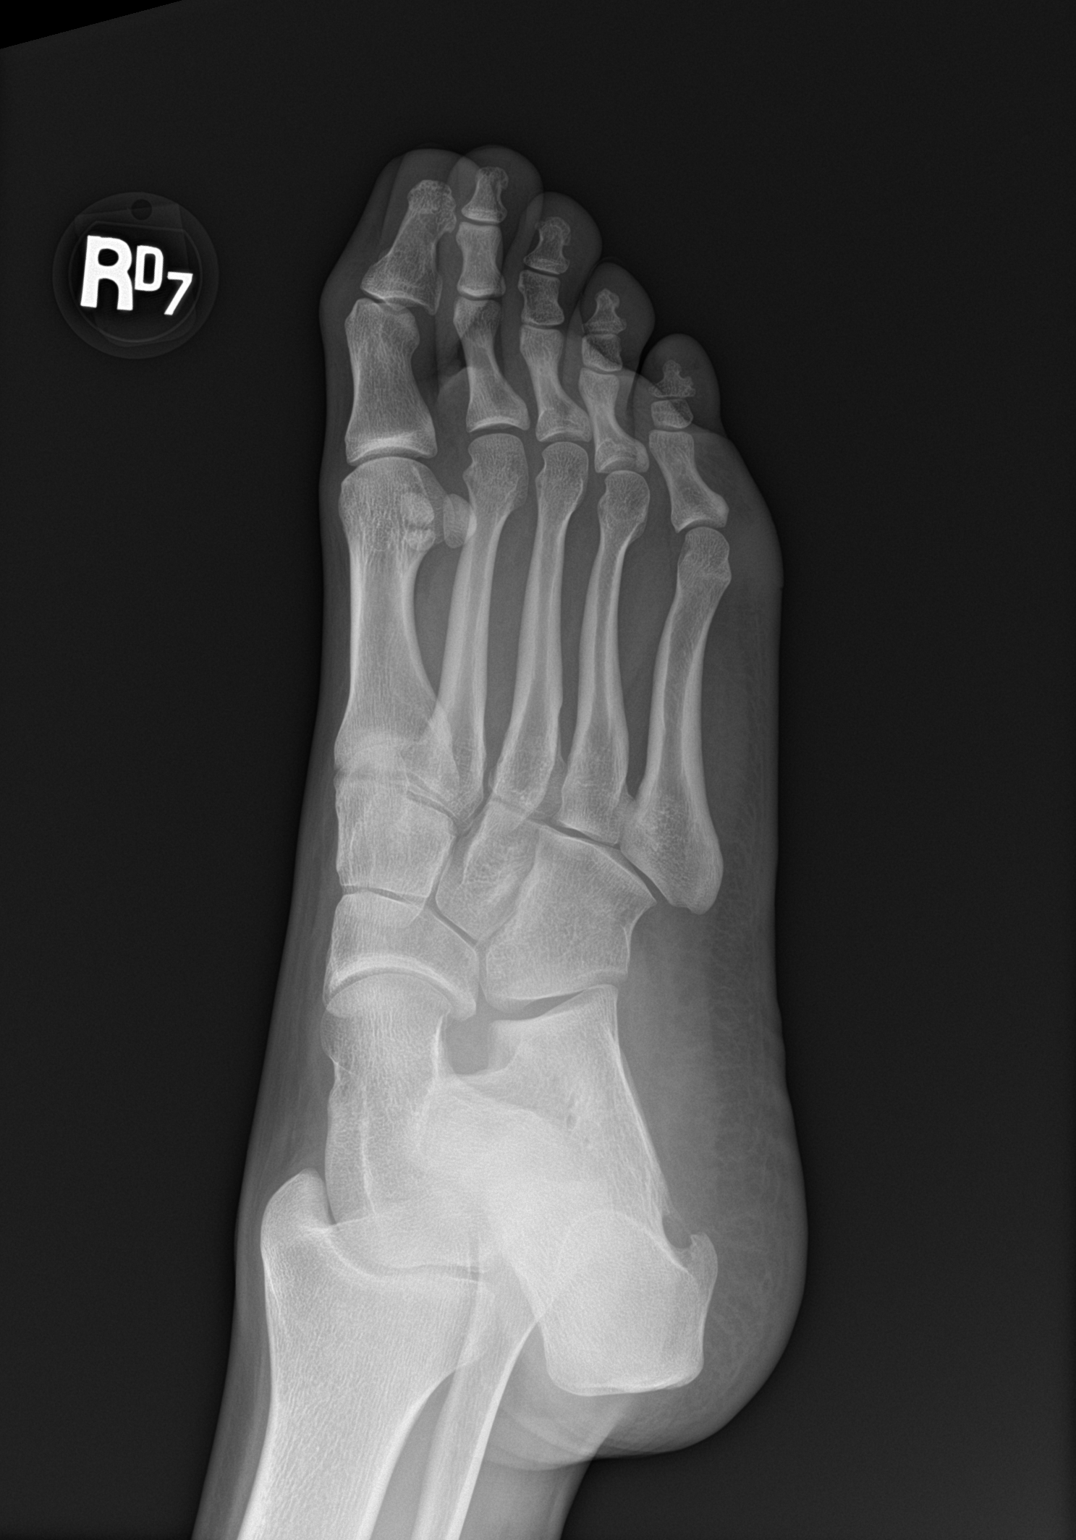

[foot lat]
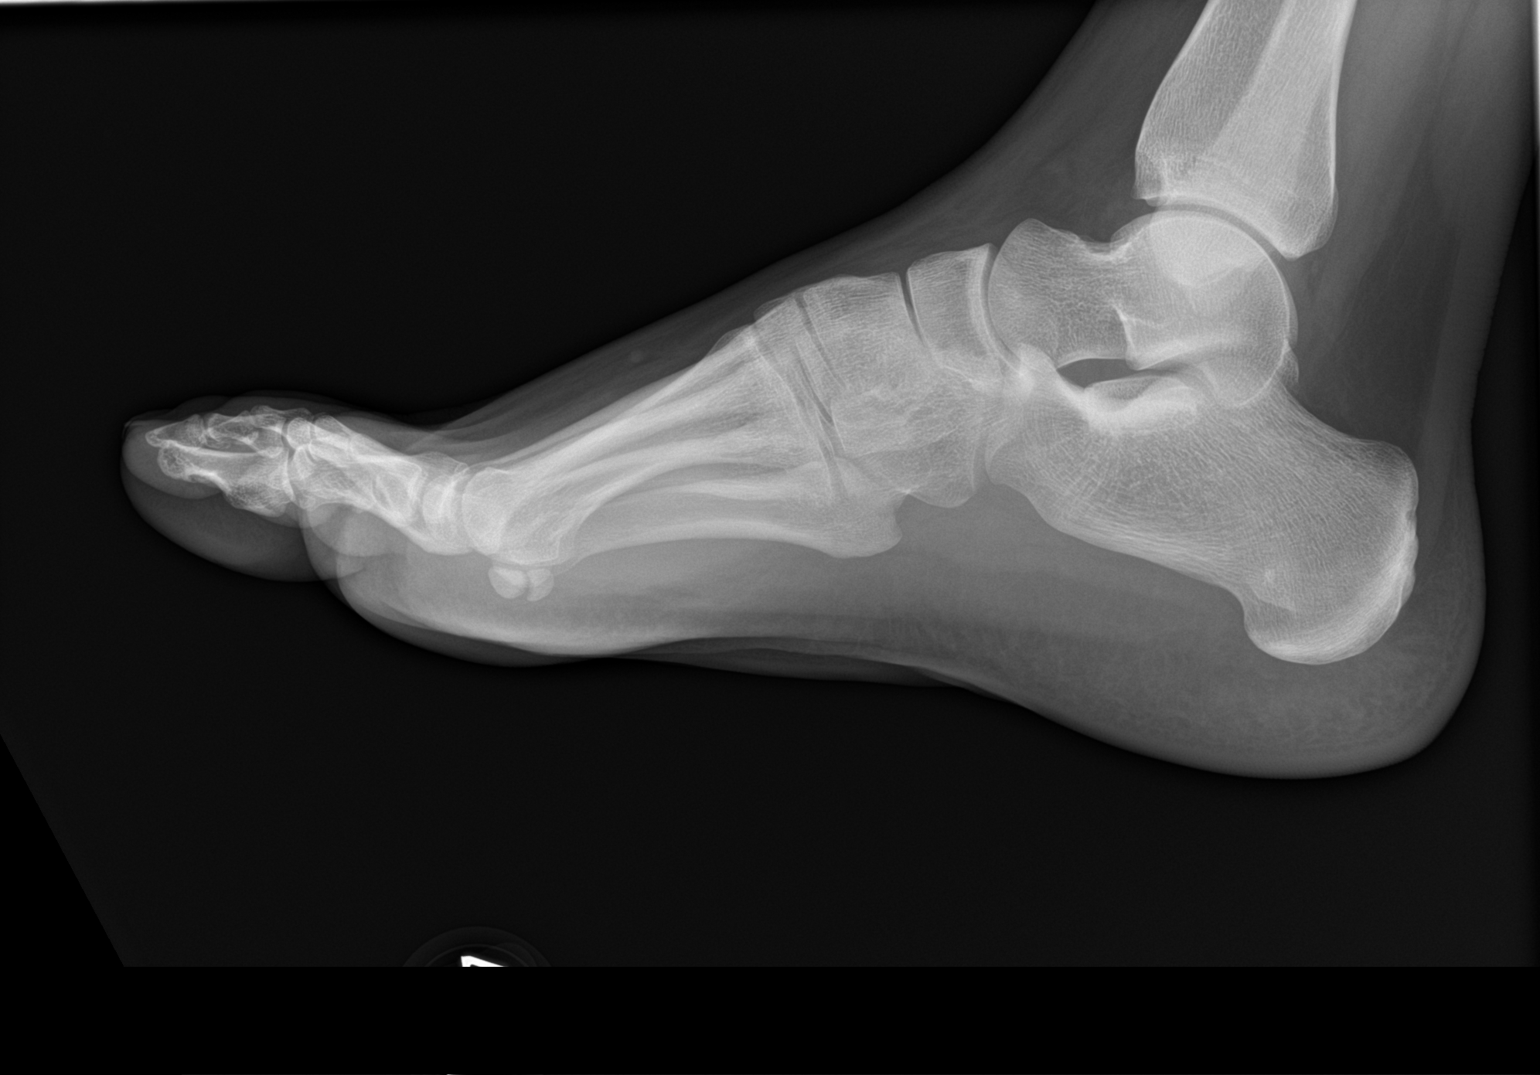

[3 of 3 positions shown; findings below may reference images not displayed]

FINDINGS: There is no acute osseous abnormality. There is no retained
radiopaque foreign body.

Alignment is within normal limits. The Lisfranc and Chopart joints
are intact. There is no soft tissue gas.
IMPRESSION: No radiopaque foreign body, soft tissue gas, or acute osseous
abnormality.

## 2023-03-01 DIAGNOSIS — F900 Attention-deficit hyperactivity disorder, predominantly inattentive type: Secondary | ICD-10-CM | POA: Diagnosis not present

## 2023-07-05 DIAGNOSIS — F900 Attention-deficit hyperactivity disorder, predominantly inattentive type: Secondary | ICD-10-CM | POA: Diagnosis not present

## 2023-10-04 ENCOUNTER — Encounter: Payer: Self-pay | Admitting: Nurse Practitioner

## 2023-10-04 ENCOUNTER — Ambulatory Visit: Payer: Medicaid Other | Admitting: Nurse Practitioner

## 2023-10-04 VITALS — BP 120/88 | HR 78 | Temp 98.8°F | Resp 16 | Ht 66.5 in | Wt 192.6 lb

## 2023-10-04 DIAGNOSIS — B372 Candidiasis of skin and nail: Secondary | ICD-10-CM

## 2023-10-04 DIAGNOSIS — F900 Attention-deficit hyperactivity disorder, predominantly inattentive type: Secondary | ICD-10-CM | POA: Diagnosis not present

## 2023-10-04 DIAGNOSIS — Z23 Encounter for immunization: Secondary | ICD-10-CM

## 2023-10-04 MED ORDER — CLOTRIMAZOLE-BETAMETHASONE 1-0.05 % EX CREA: 1.0000 | TOPICAL_CREAM | Freq: Two times a day (BID) | CUTANEOUS | 1 refills | Status: DC

## 2023-10-04 NOTE — Progress Notes (Signed)
[facility] [address] [place], [place] [address]  Internal MEDICINE  Office Visit Note  Patient Name: Adam Shields  [id number]  [id number]  Date of Service: 10/04/2023   Complaints/HPI Pt is here for establishment of PCP. Chief Complaint  Patient presents with   New Patient (Initial Visit)    Rash, itching 4 months.     HPI Adam Shields presents for a new patient visit to establish care.  Well-appearing 22 y.o. male with eczema and no other significant medical conditions.   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  Diet: fair  Exercise: not regularly  Tobacco use: none  Alcohol use: none  Illicit drug use: none  Labs: deferred for now . New or worsening pain: none    Current Medication: Outpatient Encounter Medications as of 10/04/2023  Medication Sig   [DISCONTINUED] clotrimazole-betamethasone (LOTRISONE) cream Apply 1 Application topically 2 (two) times daily.   famotidine (PEPCID) 20 MG tablet Take 1 tablet (20 mg total) by mouth 2 (two) times daily. (Patient not taking: Reported on 10/04/2023)   fluticasone (FLONASE) 50 MCG/ACT nasal spray Place 2 sprays into both nostrils daily. (Patient not taking: Reported on 10/04/2023)   loperamide (IMODIUM) 2 MG capsule Take 1 capsule (2 mg total) by mouth as needed for diarrhea or loose stools. (Patient not taking: Reported on 10/04/2023)   polyethylene glycol (MIRALAX / GLYCOLAX) packet Take 17 g by mouth daily. (Patient not taking: Reported on 10/04/2023)   polyethylene glycol (MIRALAX) packet Take 17 g by mouth daily. (Patient not taking: Reported on 10/04/2023)   promethazine-dextromethorphan (PROMETHAZINE-DM) 6.25-15 MG/5ML syrup Take 5 mLs by mouth 4 (four) times daily as needed for cough. (Patient not taking: Reported on 10/04/2023)   VYVANSE 40 MG capsule Take 40 mg by mouth every morning.   No facility-administered encounter medications on  file as of 10/04/2023.    Surgical History: History reviewed. No pertinent surgical history.  Medical History: History reviewed. No pertinent past medical history.  Family History: Family History  Problem Relation Age of Onset   Bipolar disorder Father     Social History   Socioeconomic History   Marital status: Single    Spouse name: Not on file   Number of children: Not on file   Years of education: Not on file   Highest education level: Not on file  Occupational History   Not on file  Tobacco Use   Smoking status: Former    Current packs/day: 0.00    Types: Cigarettes    Quit date: 06/04/2021    Years since quitting: 2.4   Smokeless tobacco: Never   Tobacco comments:    Quit between in January-March  Vaping Use   Vaping status: Never Used  Substance and Sexual Activity   Alcohol use: No   Drug use: No   Sexual activity: Yes  Other Topics Concern   Not on file  Social History Narrative   Not on file   Social Determinants of Health   Financial Resource Strain: Not on file  Food Insecurity: Not on file  Transportation Needs: Not on file  Physical Activity: Not on file  Stress: Not on file  Social Connections: Not on file  Intimate Partner Violence: Not on file     Review of Systems  Constitutional:  Negative for chills, fatigue and unexpected weight change.  HENT:  Negative for congestion, postnasal drip, rhinorrhea, sneezing and sore throat.  Eyes:  Negative for redness.  Respiratory:  Negative for cough, chest tightness and shortness of breath.   Cardiovascular:  Negative for chest pain and palpitations.  Gastrointestinal:  Negative for abdominal pain, constipation, diarrhea, nausea and vomiting.  Genitourinary:  Negative for dysuria and frequency.  Musculoskeletal:  Negative for arthralgias, back pain, joint swelling and neck pain.  Skin:  Positive for rash (buttocks, around anus, yeast like).  Neurological: Negative.  Negative for tremors and  numbness.  Hematological:  Negative for adenopathy. Does not bruise/bleed easily.  Psychiatric/Behavioral:  Negative for behavioral problems (Depression), sleep disturbance and suicidal ideas. The patient is not nervous/anxious.     Vital Signs: BP 120/88   Pulse 78   Temp 98.8 F (37.1 C)   Resp 16   Ht 5' 6.5" (1.689 m)   Wt 192 lb 9.6 oz (87.4 kg)   SpO2 98%   BMI 30.62 kg/m    Physical Exam Vitals reviewed.  Constitutional:      Appearance: Normal appearance. He is normal weight. He is not ill-appearing.  HENT:     Head: Normocephalic and atraumatic.  Eyes:     Pupils: Pupils are equal, round, and reactive to light.  Cardiovascular:     Rate and Rhythm: Normal rate and regular rhythm.  Pulmonary:     Effort: Pulmonary effort is normal. No respiratory distress.  Genitourinary:    Comments: Yeast like rash around anus  Skin:    Findings: Rash (yeast like rash around anus) present.  Neurological:     Mental Status: He is alert and oriented to person, place, and time.  Psychiatric:        Mood and Affect: Mood normal.        Behavior: Behavior normal.       Assessment/Plan: 1. Yeast dermatitis Topical lotrisone prescribed.   2. Needs flu shot Flu vaccine administered in office today.  - Influenza, MDCK, trivalent, PF(Flucelvax egg-free)     General Counseling: Adam Shields verbalizes understanding of the findings of todays visit and agrees with plan of treatment. I have discussed any further diagnostic evaluation that may be needed or ordered today. We also reviewed his medications today. he has been encouraged to call the office with any questions or concerns that should arise related to todays visit.    Orders Placed This Encounter  Procedures   Influenza, MDCK, trivalent, PF(Flucelvax egg-free)    Meds ordered this encounter  Medications   DISCONTD: clotrimazole-betamethasone (LOTRISONE) cream    Sig: Apply 1 Application topically 2 (two) times daily.     Dispense:  45 g    Refill:  1    Return for CPE, Hjalmar Ballengee PCP at earliest available opening of patient's preference. .  Time spent:30 Minutes Time spent with patient included reviewing progress notes, labs, imaging studies, and discussing plan for follow up.   [place] Controlled Substance Database was reviewed by me for overdose risk score (ORS)   This patient was seen by Sallyanne Kuster, FNP-C in collaboration with Dr. Beverely Risen as a part of collaborative care agreement.   Pooja Camuso R. Tedd Sias, MSN, FNP-C Internal Medicine

## 2023-11-13 ENCOUNTER — Other Ambulatory Visit: Payer: Self-pay | Admitting: Nurse Practitioner

## 2023-11-19 ENCOUNTER — Encounter: Payer: Self-pay | Admitting: Nurse Practitioner

## 2023-11-21 ENCOUNTER — Ambulatory Visit (INDEPENDENT_AMBULATORY_CARE_PROVIDER_SITE_OTHER): Payer: Medicaid Other | Admitting: Nurse Practitioner

## 2023-11-21 ENCOUNTER — Encounter: Payer: Self-pay | Admitting: Nurse Practitioner

## 2023-11-21 VITALS — BP 122/84 | HR 78 | Temp 98.3°F | Resp 16 | Ht 66.5 in | Wt 197.8 lb

## 2023-11-21 DIAGNOSIS — Z23 Encounter for immunization: Secondary | ICD-10-CM

## 2023-11-21 DIAGNOSIS — B372 Candidiasis of skin and nail: Secondary | ICD-10-CM

## 2023-11-21 DIAGNOSIS — E66811 Obesity, class 1: Secondary | ICD-10-CM

## 2023-11-21 DIAGNOSIS — Z0001 Encounter for general adult medical examination with abnormal findings: Secondary | ICD-10-CM

## 2023-11-21 DIAGNOSIS — Z6831 Body mass index (BMI) 31.0-31.9, adult: Secondary | ICD-10-CM | POA: Diagnosis not present

## 2023-11-21 DIAGNOSIS — F900 Attention-deficit hyperactivity disorder, predominantly inattentive type: Secondary | ICD-10-CM | POA: Diagnosis not present

## 2023-11-21 DIAGNOSIS — E6609 Other obesity due to excess calories: Secondary | ICD-10-CM

## 2023-11-21 MED ORDER — TETANUS-DIPHTH-ACELL PERTUSSIS 5-2.5-18.5 LF-MCG/0.5 IM SUSP: 0.5000 mL | Freq: Once | INTRAMUSCULAR | 0 refills | Status: AC

## 2023-11-21 NOTE — Progress Notes (Signed)
[facility] [address] [place], [place] [address]  Internal MEDICINE  Office Visit Note  Patient Name: Adam Shields  [id number]  [id number]  Date of Service: 11/21/2023  Chief Complaint  Patient presents with   Annual Exam    HPI Adam Shields presents for an annual well visit and physical exam.  Well-appearing 22 y.o. male with obesity and ADHD and no other significant medication conditions.  Labs: deferred for now.  New or worsening pain: none  Has ADHD, sees [facility] in [place] and is on vyvanse 50 mg daily.    Current Medication: Outpatient Encounter Medications as of 11/21/2023  Medication Sig   clotrimazole-betamethasone (LOTRISONE) cream APPLY TO AFFECTED AREA(s) TWICE DAILY   Tdap (BOOSTRIX) 5-2.5-18.5 LF-MCG/0.5 injection Inject 0.5 mLs into the muscle once for 1 dose.   VYVANSE 50 MG capsule Take 50 mg by mouth every morning.   [DISCONTINUED] VYVANSE 40 MG capsule Take 40 mg by mouth every morning.   fluticasone (FLONASE) 50 MCG/ACT nasal spray Place 2 sprays into both nostrils daily. (Patient not taking: Reported on 11/21/2023)   [DISCONTINUED] famotidine (PEPCID) 20 MG tablet Take 1 tablet (20 mg total) by mouth 2 (two) times daily. (Patient not taking: Reported on 11/21/2023)   [DISCONTINUED] loperamide (IMODIUM) 2 MG capsule Take 1 capsule (2 mg total) by mouth as needed for diarrhea or loose stools. (Patient not taking: Reported on 11/21/2023)   [DISCONTINUED] polyethylene glycol (MIRALAX / GLYCOLAX) packet Take 17 g by mouth daily. (Patient not taking: Reported on 11/21/2023)   [DISCONTINUED] polyethylene glycol (MIRALAX) packet Take 17 g by mouth daily. (Patient not taking: Reported on 11/21/2023)   [DISCONTINUED] promethazine-dextromethorphan (PROMETHAZINE-DM) 6.25-15 MG/5ML syrup Take 5 mLs by mouth 4 (four) times daily as needed for cough. (Patient not taking: Reported on 11/21/2023)   No facility-administered encounter  medications on file as of 11/21/2023.    Surgical History: History reviewed. No pertinent surgical history.  Medical History: History reviewed. No pertinent past medical history.  Family History: Family History  Problem Relation Age of Onset   Bipolar disorder Father     Social History   Socioeconomic History   Marital status: Single    Spouse name: Not on file   Number of children: Not on file   Years of education: Not on file   Highest education level: Not on file  Occupational History   Not on file  Tobacco Use   Smoking status: Former    Current packs/day: 0.00    Types: Cigarettes    Quit date: 06/04/2021    Years since quitting: 2.4   Smokeless tobacco: Never   Tobacco comments:    Quit between in January-March  Vaping Use   Vaping status: Never Used  Substance and Sexual Activity   Alcohol use: No   Drug use: No   Sexual activity: Yes  Other Topics Concern   Not on file  Social History Narrative   Not on file   Social Determinants of Health   Financial Resource Strain: Not on file  Food Insecurity: Not on file  Transportation Needs: Not on file  Physical Activity: Not on file  Stress: Not on file  Social Connections: Not on file  Intimate Partner Violence: Not on file      Review of Systems  Constitutional:  Negative for activity change, appetite change, chills, fatigue, fever and unexpected weight change.  HENT: Negative.  Negative for congestion, ear pain, rhinorrhea, sore throat and trouble swallowing.  Eyes: Negative.   Respiratory: Negative.  Negative for cough, chest tightness, shortness of breath and wheezing.   Cardiovascular: Negative.  Negative for chest pain and palpitations.  Gastrointestinal: Negative.  Negative for abdominal pain, blood in stool, constipation, diarrhea, nausea and vomiting.  Endocrine: Negative.   Genitourinary: Negative.  Negative for difficulty urinating, dysuria, frequency, hematuria and urgency.   Musculoskeletal: Negative.  Negative for arthralgias, back pain, joint swelling, myalgias and neck pain.  Skin: Negative.  Negative for rash and wound.  Allergic/Immunologic: Negative.  Negative for immunocompromised state.  Neurological: Negative.  Negative for dizziness, seizures, numbness and headaches.  Hematological: Negative.   Psychiatric/Behavioral: Negative.  Negative for behavioral problems, self-injury and suicidal ideas. The patient is not nervous/anxious.     Vital Signs: BP 122/84   Pulse 78   Temp 98.3 F (36.8 C)   Resp 16   Ht 5' 6.5" (1.689 m)   Wt 197 lb 12.8 oz (89.7 kg)   SpO2 98%   BMI 31.45 kg/m    Physical Exam Vitals reviewed.  Constitutional:      General: He is awake. He is not in acute distress.    Appearance: Normal appearance. He is well-developed and well-groomed. He is obese. He is not ill-appearing.  HENT:     Head: Normocephalic and atraumatic.     Right Ear: Tympanic membrane, ear canal and external ear normal.     Left Ear: Tympanic membrane, ear canal and external ear normal.     Nose: Nose normal. No congestion or rhinorrhea.     Mouth/Throat:     Mouth: Mucous membranes are moist.     Pharynx: Oropharynx is clear. No oropharyngeal exudate or posterior oropharyngeal erythema.  Eyes:     Extraocular Movements: Extraocular movements intact.     Conjunctiva/sclera: Conjunctivae normal.     Pupils: Pupils are equal, round, and reactive to light.  Cardiovascular:     Rate and Rhythm: Normal rate and regular rhythm.     Pulses: Normal pulses.     Heart sounds: Normal heart sounds. No murmur heard. Pulmonary:     Effort: Pulmonary effort is normal. No respiratory distress.     Breath sounds: Normal breath sounds. No wheezing.  Abdominal:     General: Bowel sounds are normal. There is no distension.     Palpations: Abdomen is soft. There is no mass.     Tenderness: There is no abdominal tenderness. There is no guarding or rebound.      Hernia: No hernia is present.  Musculoskeletal:        General: Normal range of motion.     Cervical back: Normal range of motion and neck supple.     Right lower leg: No edema.     Left lower leg: No edema.  Lymphadenopathy:     Cervical: No cervical adenopathy.  Skin:    General: Skin is warm and dry.     Capillary Refill: Capillary refill takes less than 2 seconds.     Coloration: Skin is not jaundiced or pale.     Findings: No lesion or rash.  Neurological:     Mental Status: He is alert and oriented to person, place, and time.  Psychiatric:        Mood and Affect: Mood normal.        Behavior: Behavior normal. Behavior is cooperative.        Assessment/Plan: 1. Encounter for routine adult health examination with abnormal findings Age-appropriate preventive screenings  and vaccinations discussed, annual physical exam completed. Routine labs for health maintenance deferred. PHM updated.   2. Yeast dermatitis Resolved   3. Class 1 obesity due to excess calories without serious comorbidity with body mass index (BMI) of 31.0 to 31.9 in adult Encourage healthy eating and exercise  4. Need for vaccination - Tdap (BOOSTRIX) 5-2.5-18.5 LF-MCG/0.5 injection; Inject 0.5 mLs into the muscle once for 1 dose.  Dispense: 0.5 mL; Refill: 0  5. Attention deficit hyperactivity disorder (ADHD), predominantly inattentive type Continue following up with Adam Shields behavioral health.  - VYVANSE 50 MG capsule; Take 50 mg by mouth every morning.      General Counseling: Jayon verbalizes understanding of the findings of todays visit and agrees with plan of treatment. I have discussed any further diagnostic evaluation that may be needed or ordered today. We also reviewed his medications today. he has been encouraged to call the office with any questions or concerns that should arise related to todays visit.    No orders of the defined types were placed in this encounter.   Meds ordered  this encounter  Medications   Tdap (BOOSTRIX) 5-2.5-18.5 LF-MCG/0.5 injection    Sig: Inject 0.5 mLs into the muscle once for 1 dose.    Dispense:  0.5 mL    Refill:  0    Due for tetanus vaccine    Return in about 1 year (around 11/20/2024) for CPE, Marilla Boddy PCP.   Total time spent:30 Minutes Time spent includes review of chart, medications, test results, and follow up plan with the patient.   [place] Controlled Substance Database was reviewed by me.  This patient was seen by Sallyanne Kuster, FNP-C in collaboration with Dr. Beverely Risen as a part of collaborative care agreement.  Lori-Ann Lindfors R. Tedd Sias, MSN, FNP-C Internal medicine

## 2024-01-03 DIAGNOSIS — F900 Attention-deficit hyperactivity disorder, predominantly inattentive type: Secondary | ICD-10-CM | POA: Diagnosis not present

## 2024-04-17 DIAGNOSIS — F900 Attention-deficit hyperactivity disorder, predominantly inattentive type: Secondary | ICD-10-CM | POA: Diagnosis not present

## 2024-07-10 DIAGNOSIS — F900 Attention-deficit hyperactivity disorder, predominantly inattentive type: Secondary | ICD-10-CM | POA: Diagnosis not present

## 2024-08-01 ENCOUNTER — Other Ambulatory Visit: Payer: Self-pay

## 2024-08-01 MED ORDER — CLOTRIMAZOLE-BETAMETHASONE 1-0.05 % EX CREA: TOPICAL_CREAM | Freq: Two times a day (BID) | CUTANEOUS | 1 refills | Status: AC

## 2024-10-10 DIAGNOSIS — F900 Attention-deficit hyperactivity disorder, predominantly inattentive type: Secondary | ICD-10-CM | POA: Diagnosis not present

## 2024-11-19 ENCOUNTER — Telehealth: Payer: Self-pay | Admitting: Nurse Practitioner

## 2024-11-19 NOTE — Telephone Encounter (Signed)
 Left vm and sent mychart message to confirm 11/26/24 appointment-Toni

## 2024-11-26 ENCOUNTER — Encounter: Payer: Medicaid Other | Admitting: Nurse Practitioner

## 2024-11-26 ENCOUNTER — Telehealth: Payer: Self-pay | Admitting: Nurse Practitioner

## 2024-11-26 NOTE — Telephone Encounter (Signed)
 Lvm & sent message to reschedule 11/26/2024 missed appointment-Toni

## 2024-12-04 ENCOUNTER — Telehealth: Payer: Self-pay | Admitting: Nurse Practitioner

## 2024-12-04 NOTE — Telephone Encounter (Signed)
 Left 2nd  vm regarding missed appointment-Toni

## 2024-12-24 ENCOUNTER — Telehealth: Payer: Self-pay | Admitting: Nurse Practitioner

## 2024-12-24 NOTE — Telephone Encounter (Signed)
 No return call to reschedule missed appointment. Mailed letter-Toni

## 2025-01-08 ENCOUNTER — Telehealth: Payer: Self-pay | Admitting: Nurse Practitioner

## 2025-01-08 NOTE — Telephone Encounter (Signed)
 Received letter back. Address incorrect. Corrected. Mailed out again-Toni
